# Patient Record
Sex: Female | Born: 1945 | Race: White | Hispanic: No | Marital: Single | Smoking: Never smoker
Health system: Southern US, Community
[De-identification: ages and names within clinical notes are randomized; demographics above are authoritative.]

## PROBLEM LIST (undated history)

## (undated) DIAGNOSIS — E7800 Pure hypercholesterolemia, unspecified: Secondary | ICD-10-CM

## (undated) DIAGNOSIS — C530 Malignant neoplasm of endocervix: Secondary | ICD-10-CM

## (undated) DIAGNOSIS — F3340 Major depressive disorder, recurrent, in remission, unspecified: Secondary | ICD-10-CM

## (undated) DIAGNOSIS — M179 Osteoarthritis of knee, unspecified: Secondary | ICD-10-CM

## (undated) DIAGNOSIS — Z6841 Body Mass Index (BMI) 40.0 and over, adult: Secondary | ICD-10-CM

## (undated) DIAGNOSIS — F5101 Primary insomnia: Secondary | ICD-10-CM

## (undated) DIAGNOSIS — I10 Essential (primary) hypertension: Secondary | ICD-10-CM

## (undated) DIAGNOSIS — M1710 Unilateral primary osteoarthritis, unspecified knee: Secondary | ICD-10-CM

## (undated) DIAGNOSIS — E119 Type 2 diabetes mellitus without complications: Secondary | ICD-10-CM

## (undated) DIAGNOSIS — Z9884 Bariatric surgery status: Secondary | ICD-10-CM

## (undated) DIAGNOSIS — D509 Iron deficiency anemia, unspecified: Secondary | ICD-10-CM

## (undated) HISTORY — PX: COLONOSCOPY: SHX174

## (undated) HISTORY — PX: OOPHORECTOMY: SHX86

## (undated) HISTORY — PX: TUBAL LIGATION: SHX77

## (undated) HISTORY — PX: ABDOMINAL HYSTERECTOMY: SHX81

---

## 2010-05-05 HISTORY — PX: ROUX-EN-Y GASTRIC BYPASS: SHX1104

## 2015-10-02 ENCOUNTER — Encounter: Payer: Self-pay | Admitting: *Deleted

## 2015-10-03 ENCOUNTER — Ambulatory Visit: Payer: Medicare Other | Admitting: Anesthesiology

## 2015-10-03 ENCOUNTER — Ambulatory Visit
Admission: RE | Admit: 2015-10-03 | Discharge: 2015-10-03 | Disposition: A | Discharge status: Home / Self Care | Payer: Medicare Other | Source: Ambulatory Visit | Attending: Unknown Physician Specialty | Admitting: Unknown Physician Specialty

## 2015-10-03 ENCOUNTER — Encounter
Admission: RE | Disposition: A | Discharge status: Home / Self Care | Payer: Self-pay | Source: Ambulatory Visit | Attending: Unknown Physician Specialty

## 2015-10-03 ENCOUNTER — Encounter: Payer: Self-pay | Admitting: *Deleted

## 2015-10-03 DIAGNOSIS — F329 Major depressive disorder, single episode, unspecified: Secondary | ICD-10-CM | POA: Diagnosis not present

## 2015-10-03 DIAGNOSIS — Z1211 Encounter for screening for malignant neoplasm of colon: Secondary | ICD-10-CM | POA: Diagnosis not present

## 2015-10-03 DIAGNOSIS — D122 Benign neoplasm of ascending colon: Secondary | ICD-10-CM | POA: Diagnosis not present

## 2015-10-03 DIAGNOSIS — I10 Essential (primary) hypertension: Secondary | ICD-10-CM | POA: Diagnosis not present

## 2015-10-03 DIAGNOSIS — F5101 Primary insomnia: Secondary | ICD-10-CM | POA: Diagnosis not present

## 2015-10-03 DIAGNOSIS — K640 First degree hemorrhoids: Secondary | ICD-10-CM | POA: Diagnosis not present

## 2015-10-03 DIAGNOSIS — Z79899 Other long term (current) drug therapy: Secondary | ICD-10-CM | POA: Diagnosis not present

## 2015-10-03 DIAGNOSIS — K5520 Angiodysplasia of colon without hemorrhage: Secondary | ICD-10-CM | POA: Insufficient documentation

## 2015-10-03 DIAGNOSIS — M179 Osteoarthritis of knee, unspecified: Secondary | ICD-10-CM | POA: Diagnosis not present

## 2015-10-03 DIAGNOSIS — Z8541 Personal history of malignant neoplasm of cervix uteri: Secondary | ICD-10-CM | POA: Insufficient documentation

## 2015-10-03 DIAGNOSIS — Z86010 Personal history of colonic polyps: Secondary | ICD-10-CM | POA: Diagnosis not present

## 2015-10-03 DIAGNOSIS — Z9884 Bariatric surgery status: Secondary | ICD-10-CM | POA: Diagnosis not present

## 2015-10-03 DIAGNOSIS — Z7982 Long term (current) use of aspirin: Secondary | ICD-10-CM | POA: Insufficient documentation

## 2015-10-03 DIAGNOSIS — E7800 Pure hypercholesterolemia, unspecified: Secondary | ICD-10-CM | POA: Diagnosis not present

## 2015-10-03 HISTORY — DX: Unilateral primary osteoarthritis, unspecified knee: M17.10

## 2015-10-03 HISTORY — DX: Osteoarthritis of knee, unspecified: M17.9

## 2015-10-03 HISTORY — PX: COLONOSCOPY WITH PROPOFOL: SHX5780

## 2015-10-03 HISTORY — DX: Primary insomnia: F51.01

## 2015-10-03 HISTORY — DX: Major depressive disorder, recurrent, in remission, unspecified: F33.40

## 2015-10-03 HISTORY — DX: Type 2 diabetes mellitus without complications: E11.9

## 2015-10-03 HISTORY — DX: Morbid (severe) obesity due to excess calories: E66.01

## 2015-10-03 HISTORY — DX: Malignant neoplasm of endocervix: C53.0

## 2015-10-03 HISTORY — DX: Iron deficiency anemia, unspecified: D50.9

## 2015-10-03 HISTORY — DX: Essential (primary) hypertension: I10

## 2015-10-03 HISTORY — DX: Pure hypercholesterolemia, unspecified: E78.00

## 2015-10-03 HISTORY — DX: Body Mass Index (BMI) 40.0 and over, adult: Z684

## 2015-10-03 HISTORY — DX: Bariatric surgery status: Z98.84

## 2015-10-03 SURGERY — COLONOSCOPY WITH PROPOFOL
Anesthesia: General

## 2015-10-03 MED ORDER — SODIUM CHLORIDE 0.9 % IV SOLN: INTRAVENOUS | Status: DC

## 2015-10-03 MED ORDER — LACTATED RINGERS IV SOLN
INTRAVENOUS | Status: DC | PRN
  Administered 2015-10-03: 10:00:00 via INTRAVENOUS

## 2015-10-03 MED ORDER — SODIUM CHLORIDE 0.9 % IV SOLN
INTRAVENOUS | Status: DC
  Administered 2015-10-03: 1000 mL via INTRAVENOUS

## 2015-10-03 MED ORDER — PROPOFOL 500 MG/50ML IV EMUL
INTRAVENOUS | Status: DC | PRN
  Administered 2015-10-03: 75 ug/kg/min via INTRAVENOUS

## 2015-10-03 MED ORDER — FENTANYL CITRATE (PF) 100 MCG/2ML IJ SOLN
INTRAMUSCULAR | Status: DC | PRN
  Administered 2015-10-03: 50 ug via INTRAVENOUS

## 2015-10-03 MED ORDER — MIDAZOLAM HCL 2 MG/2ML IJ SOLN
INTRAMUSCULAR | Status: DC | PRN
  Administered 2015-10-03: 1 mg via INTRAVENOUS

## 2015-10-03 NOTE — H&P (Signed)
Primary Care Physician:  Barbaraann Boys, MD Primary Gastroenterologist:  Dr. Vira Agar  Pre-Procedure History & Physical: HPI:  Brooke Goodwin is a 70 y.o. female is here for an colonoscopy.   Past Medical History  Diagnosis Date  . Diabetes mellitus type 2, controlled, without complications ([place])   . Depression, major, recurrent, in remission ([place])   . Hypertension   . Hypercholesterolemia   . Hyperplastic colonic polyp   . Iron deficiency anemia   . Morbid obesity with BMI of 45.0-49.9, adult ([place])   . Osteoarthritis of knee   . Primary insomnia   . S/P gastric bypass   . Malignant neoplasm of endocervix [facility])     Past Surgical History  Procedure Laterality Date  . Roux-en-y gastric bypass  2012  . Colonoscopy    . Abdominal hysterectomy    . Tubal ligation      Prior to Admission medications   Medication Sig Start Date End Date Taking? Authorizing Provider  acetaminophen (TYLENOL) 500 MG tablet Take 1,000 mg by mouth every 6 (six) hours as needed.   Yes Historical Provider, MD  aspirin 81 MG tablet Take 81 mg by mouth daily.   Yes Historical Provider, MD  Cholecalciferol (VITAMIN D3) 3000 units TABS Take by mouth.   Yes Historical Provider, MD  ferrous sulfate (FER-IN-SOL) 75 (15 Fe) MG/ML SOLN Take by mouth.   Yes Historical Provider, MD  FLUoxetine (PROZAC) 40 MG capsule Take 40 mg by mouth daily.   Yes Historical Provider, MD  Melatonin 3 MG TABS Take by mouth.   Yes Historical Provider, MD  Metoprolol Succinate (TOPROL XL PO) Take by mouth.   Yes Historical Provider, MD  simvastatin (ZOCOR) 40 MG tablet Take 40 mg by mouth daily.   Yes Historical Provider, MD  sodium fluoride (FLUORIDEX DAILY DEFENSE) 1.1 % GEL dental gel Place 1 application onto teeth at bedtime.   Yes Historical Provider, MD    Allergies as of 09/24/2015  . (Not on File)    History reviewed. No pertinent family history.  Social History   Social History  . Marital Status: Single   Spouse Name: N/A  . Number of Children: N/A  . Years of Education: N/A   Occupational History  . Not on file.   Social History Main Topics  . Smoking status: Never Smoker   . Smokeless tobacco: Not on file  . Alcohol Use: 2.4 oz/week    4 Shots of liquor per week  . Drug Use: No  . Sexual Activity: Not on file   Other Topics Concern  . Not on file   Social History Narrative    Review of Systems: See HPI, otherwise negative ROS  Physical Exam: BP 130/81 mmHg  Pulse 60  Temp(Src) 98.4 F (36.9 C) (Tympanic)  Resp 20  Ht 5\' 5"  (1.651 m)  Wt 130.182 kg (287 lb)  BMI 47.76 kg/m2  SpO2 96% General:   Alert,  pleasant and cooperative in NAD Head:  Normocephalic and atraumatic. Neck:  Supple; no masses or thyromegaly. Lungs:  Clear throughout to auscultation.    Heart:  Regular rate and rhythm. Abdomen:  Soft, nontender and nondistended. Normal bowel sounds, without guarding, and without rebound.   Neurologic:  Alert and  oriented x4;  grossly normal neurologically.  Impression/Plan: Brooke Goodwin is here for an colonoscopy to be performed for [facility] colon polyps  Risks, benefits, limitations, and alternatives regarding  colonoscopy have been reviewed with the patient.  Questions have been answered.  All parties agreeable.   Gaylyn Cheers, MD  10/03/2015, 10:24 AM

## 2015-10-03 NOTE — Anesthesia Postprocedure Evaluation (Signed)
Anesthesia Post Note  Patient: Brooke Goodwin  Procedure(s) Performed: Procedure(s) (LRB): COLONOSCOPY WITH PROPOFOL (N/A)  Patient location during evaluation: PACU Anesthesia Type: General Level of consciousness: awake and alert and oriented Pain management: pain level controlled Vital Signs Assessment: post-procedure vital signs reviewed and stable Respiratory status: spontaneous breathing Cardiovascular status: blood pressure returned to baseline Anesthetic complications: no    Last Vitals:  Filed Vitals:   10/03/15 1120 10/03/15 1130  BP: 152/76 174/79  Pulse: 54 51  Temp:    Resp: 24 17    Last Pain: There were no vitals filed for this visit.               Ruthie Berch

## 2015-10-03 NOTE — Anesthesia Preprocedure Evaluation (Signed)
Anesthesia Evaluation  Patient identified by MRN, date of birth, ID band Patient awake    Reviewed: Allergy & Precautions, H&P , NPO status , Patient's Chart, lab work & pertinent test results  Airway Mallampati: III  TM Distance: >3 FB Neck ROM: limited    Dental  (+) Poor Dentition, Chipped   Pulmonary neg shortness of breath,  Lung collapse as an infant.  Patient reports no lung problems since age 70   Pulmonary exam normal breath sounds clear to auscultation       Cardiovascular Exercise Tolerance: Good hypertension, (-) angina(-) Past MI and (-) DOE Normal cardiovascular exam Rhythm:regular Rate:Normal     Neuro/Psych PSYCHIATRIC DISORDERS Depression negative neurological ROS     GI/Hepatic negative GI ROS, Neg liver ROS,   Endo/Other  diabetesMorbid obesity  Renal/GU negative Renal ROS  negative genitourinary   Musculoskeletal  (+) Arthritis ,   Abdominal   Peds  Hematology negative hematology ROS (+)   Anesthesia Other Findings Past Medical History:   Diabetes mellitus type 2, controlled, without *              Depression, major, recurrent, in remission ([facility]*              Hypertension                                                 Hypercholesterolemia                                         Hyperplastic colonic polyp                                   Iron deficiency anemia                                       Morbid obesity with BMI of 45.0-49.9, adult ([facility]*              Osteoarthritis of knee                                       Primary insomnia                                             S/P gastric bypass                                           Malignant neoplasm of endocervix [facility])                      Past Surgical History:   ROUX-EN-Y GASTRIC BYPASS                         2012  COLONOSCOPY                                                   ABDOMINAL HYSTERECTOMY                                         TUBAL LIGATION                                                  Reproductive/Obstetrics negative OB ROS                             Anesthesia Physical Anesthesia Plan  ASA: III  Anesthesia Plan: General   Post-op Pain Management:    Induction:   Airway Management Planned:   Additional Equipment:   Intra-op Plan:   Post-operative Plan:   Informed Consent: I have reviewed the patients History and Physical, chart, labs and discussed the procedure including the risks, benefits and alternatives for the proposed anesthesia with the patient or authorized representative who has indicated his/her understanding and acceptance.   Dental Advisory Given  Plan Discussed with: Anesthesiologist, CRNA and Surgeon  Anesthesia Plan Comments:         Anesthesia Quick Evaluation

## 2015-10-03 NOTE — Transfer of Care (Signed)
Immediate Anesthesia Transfer of Care Note  Patient: Brooke Goodwin  Procedure(s) Performed: Procedure(s): COLONOSCOPY WITH PROPOFOL (N/A)  Patient Location: PACU  Anesthesia Type:General  Level of Consciousness: awake and alert   Airway & Oxygen Therapy: Patient Spontanous Breathing and Patient connected to nasal cannula oxygen  Post-op Assessment: Report given to RN  Post vital signs: Reviewed and stable  Last Vitals:  Filed Vitals:   10/03/15 0948 10/03/15 1100  BP: 130/81 156/64  Pulse: 60 85  Temp: 36.9 C 37 C  Resp: 20 14    Last Pain: There were no vitals filed for this visit.       Complications: No apparent anesthesia complications

## 2015-10-03 NOTE — Anesthesia Procedure Notes (Signed)
Performed by: Taleya Whitcher Oxygen Delivery Method: Nasal cannula     

## 2015-10-03 NOTE — Op Note (Signed)
[facility] Patient Name: Brooke Goodwin Procedure Date: 10/03/2015 10:21 AM MRN: [id number] Account #: [id number] Date of Birth: Oct 24, 1945 Admit Type: Outpatient Age: 70 Room: [facility] ENDO ROOM 4 Gender: Female Note Status: Finalized Procedure:            Colonoscopy Indications:          High risk colon cancer surveillance: Personal history                        of colonic polyps Providers:            Manya Silvas, MD Referring MD:         Ane Payment (Referring MD) Medicines:            Propofol per Anesthesia Complications:        No immediate complications. Procedure:            Pre-Anesthesia Assessment:                       - After reviewing the risks and benefits, the patient                        was deemed in satisfactory condition to undergo the                        procedure.                       After obtaining informed consent, the colonoscope was                        passed under direct vision. Throughout the procedure,                        the patient's blood pressure, pulse, and oxygen                        saturations were monitored continuously. The                        Colonoscope was introduced through the anus and                        advanced to the the cecum, identified by appendiceal                        orifice and ileocecal valve. The colonoscopy was                        somewhat difficult due to significant looping and a                        tortuous colon. The patient tolerated the procedure                        well. The quality of the bowel preparation was                        excellent. Findings:      A diminutive polyp was found in the proximal ascending colon. The polyp       was  sessile. The polyp was removed with a jumbo cold forceps. Resection       and retrieval were complete.      Internal hemorrhoids were found during endoscopy. The hemorrhoids were       small and Grade  I (internal hemorrhoids that do not prolapse).      The exam was otherwise without abnormality.      A single medium-sized localized angioectasia without bleeding was found       in the proximal ascending colon. No evidence of bleeding so it was left       undisturbed. Impression:           - One diminutive polyp in the proximal ascending colon,                        removed with a jumbo cold forceps. Resected and                        retrieved.                       - Internal hemorrhoids.                       - The examination was otherwise normal. Recommendation:       - Await pathology results. Manya Silvas, MD 10/03/2015 11:00:51 AM This report has been signed electronically. Number of Addenda: 0 Note Initiated On: 10/03/2015 10:21 AM Scope Withdrawal Time: 0 hours 5 minutes 52 seconds  Total Procedure Duration: 0 hours 25 minutes 51 seconds       [facility]

## 2015-10-04 ENCOUNTER — Encounter: Payer: Self-pay | Admitting: Unknown Physician Specialty

## 2015-10-04 LAB — SURGICAL PATHOLOGY

## 2016-01-14 ENCOUNTER — Other Ambulatory Visit: Payer: Self-pay | Admitting: Pediatrics

## 2016-01-14 DIAGNOSIS — Z0000 Encounter for general adult medical examination without abnormal findings: Secondary | ICD-10-CM

## 2016-02-26 ENCOUNTER — Other Ambulatory Visit: Payer: Self-pay | Admitting: Pediatrics

## 2016-02-26 DIAGNOSIS — Z1231 Encounter for screening mammogram for malignant neoplasm of breast: Secondary | ICD-10-CM

## 2016-03-03 ENCOUNTER — Ambulatory Visit
Admission: RE | Admit: 2016-03-03 | Discharge: 2016-03-03 | Disposition: A | Discharge status: Home / Self Care | Payer: Medicare Other | Source: Ambulatory Visit | Attending: Pediatrics | Admitting: Pediatrics

## 2016-03-03 DIAGNOSIS — Z0000 Encounter for general adult medical examination without abnormal findings: Secondary | ICD-10-CM | POA: Insufficient documentation

## 2016-03-03 DIAGNOSIS — Z1231 Encounter for screening mammogram for malignant neoplasm of breast: Secondary | ICD-10-CM | POA: Insufficient documentation

## 2016-03-03 DIAGNOSIS — M85852 Other specified disorders of bone density and structure, left thigh: Secondary | ICD-10-CM | POA: Diagnosis not present

## 2016-03-03 DIAGNOSIS — Z780 Asymptomatic menopausal state: Secondary | ICD-10-CM | POA: Diagnosis not present

## 2016-03-10 ENCOUNTER — Other Ambulatory Visit: Payer: Self-pay | Admitting: *Deleted

## 2016-03-10 ENCOUNTER — Ambulatory Visit
Admission: RE | Admit: 2016-03-10 | Discharge: 2016-03-10 | Disposition: A | Discharge status: Home / Self Care | Payer: Self-pay | Source: Ambulatory Visit | Attending: *Deleted | Admitting: *Deleted

## 2016-03-10 DIAGNOSIS — Z9289 Personal history of other medical treatment: Secondary | ICD-10-CM

## 2017-12-30 IMAGING — MG MM DIGITAL SCREENING BILAT W/ TOMO W/ CAD
8 of 12 series · 8 of 28 positions shown · non-contrast
Comparison: Previous exam(s).

CLINICAL DATA: Screening.

EXAM:
2D DIGITAL SCREENING BILATERAL MAMMOGRAM WITH CAD AND ADJUNCT TOMO

[L CC synth-2D]
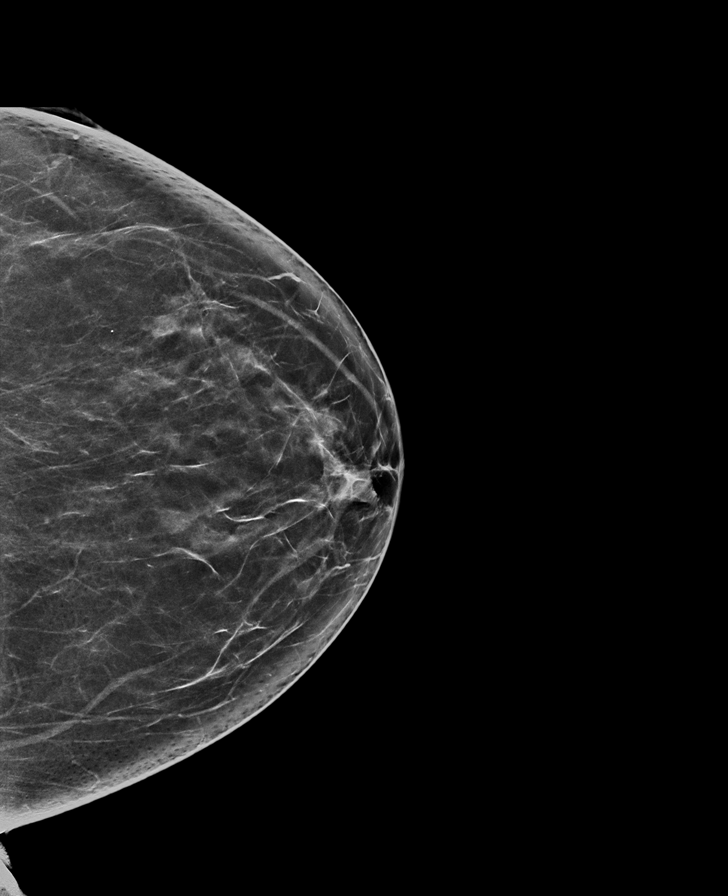

[L MLO synth-2D]
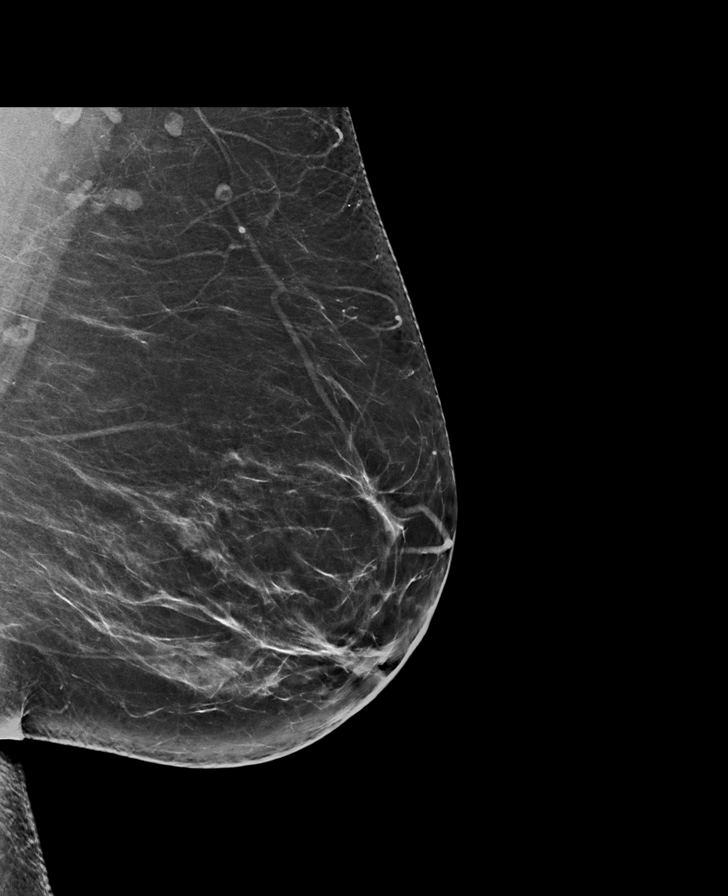

[R CC]
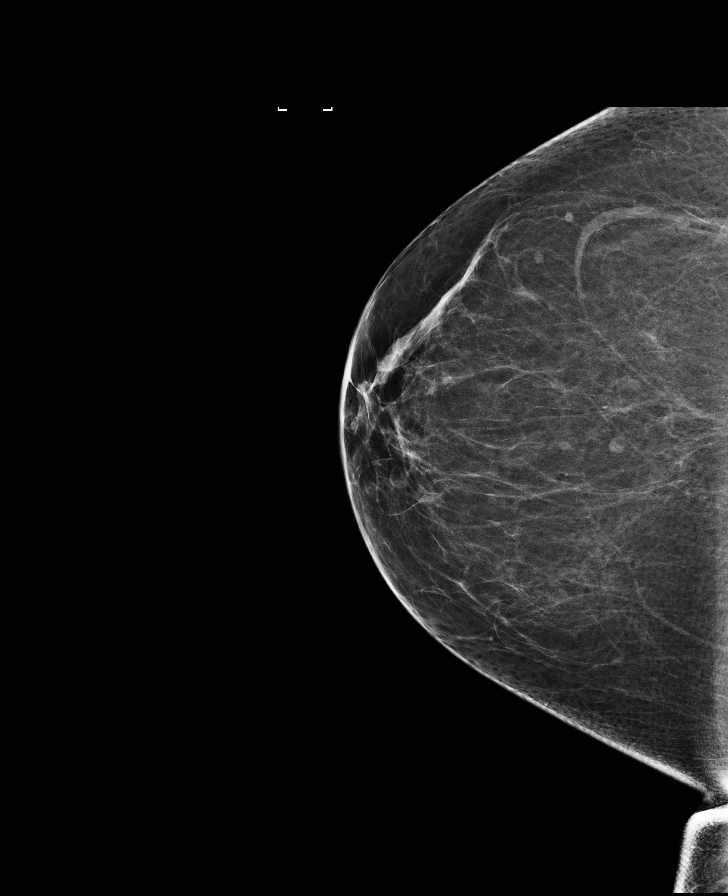

[L MLO]
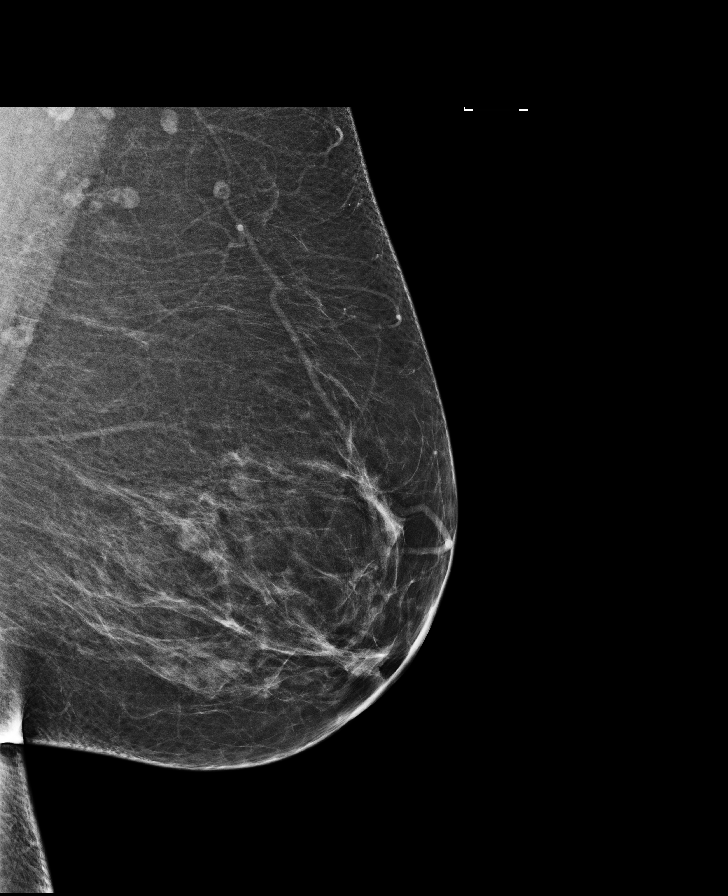

[R CC synth-2D]
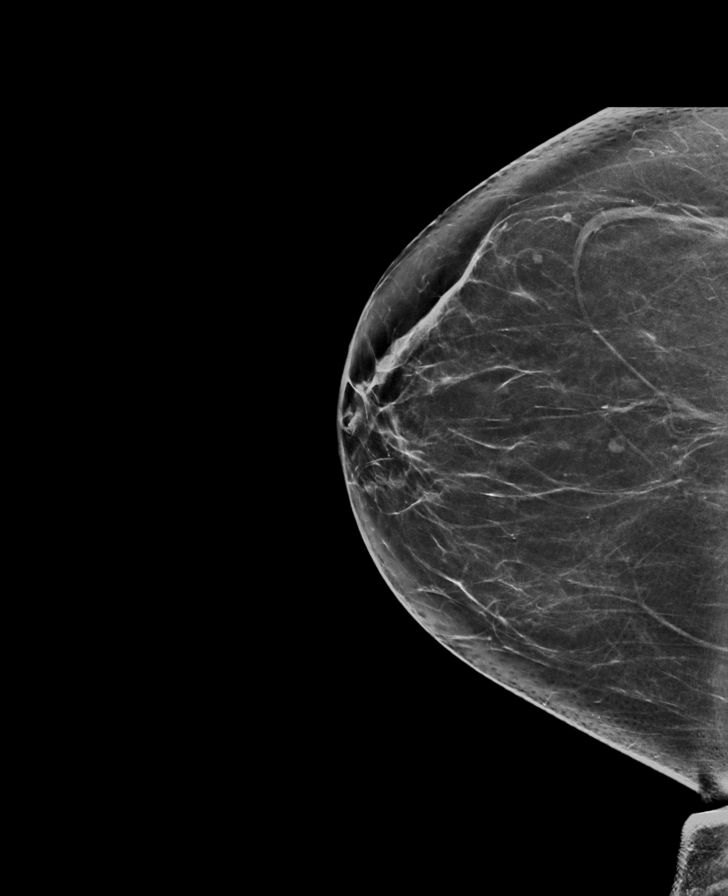

[R MLO synth-2D]
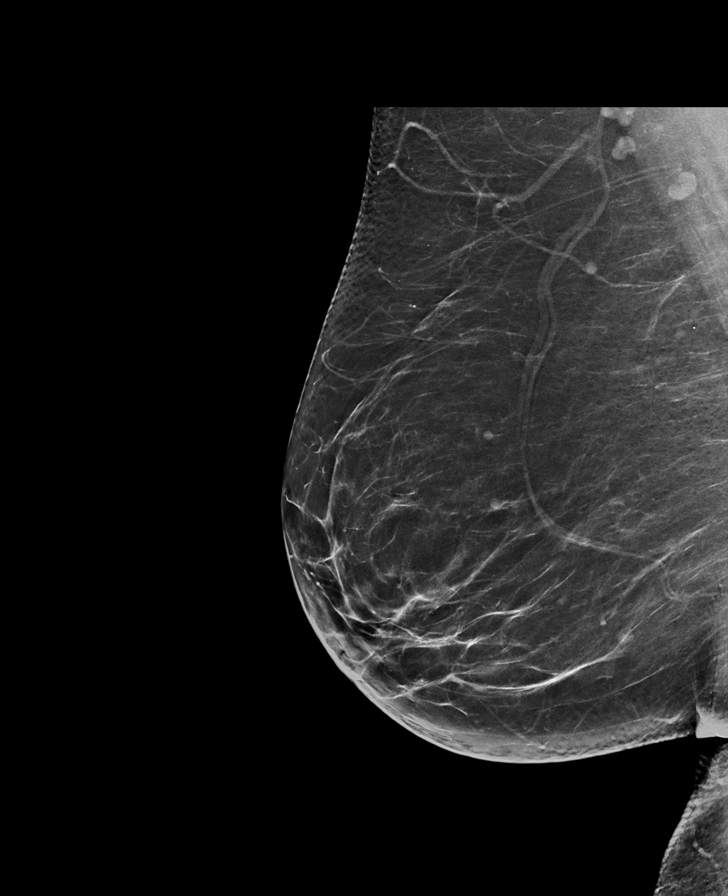

[L CC]
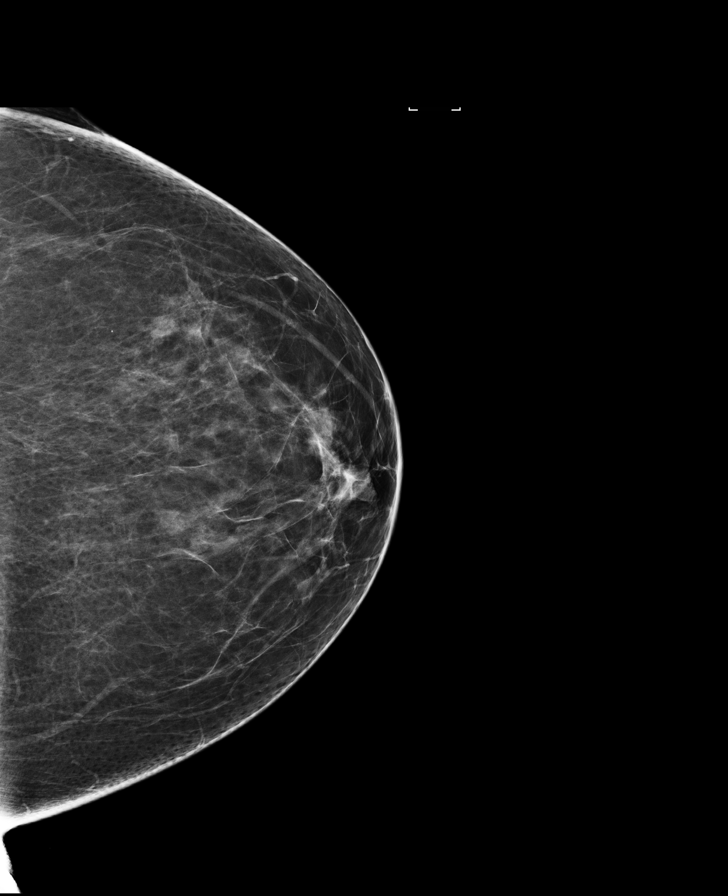

[R MLO]
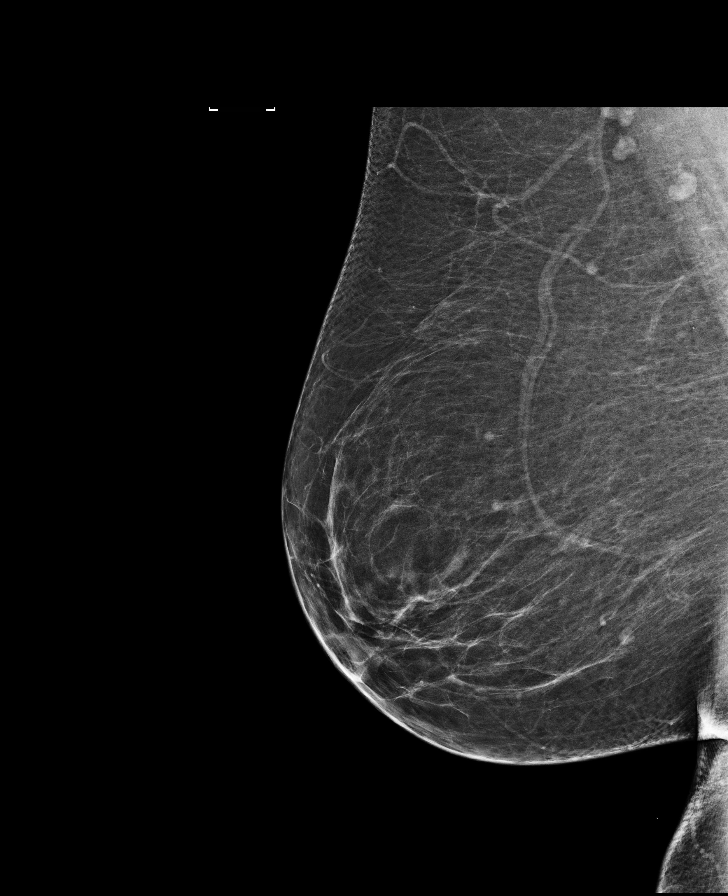

[8 of 28 positions shown; findings below may reference images not displayed]

ACR Breast Density Category b: There are scattered areas of
fibroglandular density.
FINDINGS: There are no findings suspicious for malignancy. Images were
processed with CAD.
IMPRESSION: No mammographic evidence of malignancy. A result letter of this
screening mammogram will be mailed directly to the patient.

RECOMMENDATION:
Screening mammogram in one year. (Code:97-6-RS4)

BI-RADS CATEGORY  1: Negative.

## 2018-04-14 ENCOUNTER — Other Ambulatory Visit: Payer: Self-pay | Admitting: Pediatrics

## 2018-04-14 DIAGNOSIS — Z1231 Encounter for screening mammogram for malignant neoplasm of breast: Secondary | ICD-10-CM

## 2018-04-15 ENCOUNTER — Encounter (INDEPENDENT_AMBULATORY_CARE_PROVIDER_SITE_OTHER): Payer: Self-pay

## 2018-04-15 ENCOUNTER — Ambulatory Visit
Admission: RE | Admit: 2018-04-15 | Discharge: 2018-04-15 | Disposition: A | Discharge status: Home / Self Care | Payer: Medicare Other | Source: Ambulatory Visit | Attending: Pediatrics | Admitting: Pediatrics

## 2018-04-15 DIAGNOSIS — Z1231 Encounter for screening mammogram for malignant neoplasm of breast: Secondary | ICD-10-CM | POA: Diagnosis present

## 2020-07-13 ENCOUNTER — Other Ambulatory Visit: Payer: Self-pay | Admitting: Pediatrics

## 2020-07-13 DIAGNOSIS — Z1231 Encounter for screening mammogram for malignant neoplasm of breast: Secondary | ICD-10-CM

## 2020-07-16 ENCOUNTER — Ambulatory Visit
Admission: RE | Admit: 2020-07-16 | Discharge: 2020-07-16 | Disposition: A | Discharge status: Home / Self Care | Payer: Medicare PPO | Source: Ambulatory Visit | Attending: Pediatrics | Admitting: Pediatrics

## 2020-07-16 ENCOUNTER — Other Ambulatory Visit: Payer: Self-pay

## 2020-07-16 DIAGNOSIS — Z1231 Encounter for screening mammogram for malignant neoplasm of breast: Secondary | ICD-10-CM | POA: Insufficient documentation

## 2020-07-23 ENCOUNTER — Ambulatory Visit: Payer: Medicare Other

## 2021-03-29 ENCOUNTER — Other Ambulatory Visit: Payer: Self-pay

## 2021-04-19 ENCOUNTER — Encounter: Admission: RE | Payer: Self-pay | Source: Home / Self Care

## 2021-04-19 ENCOUNTER — Ambulatory Visit: Admission: RE | Admit: 2021-04-19 | Payer: Medicare PPO | Source: Home / Self Care | Admitting: Gastroenterology

## 2021-04-19 SURGERY — COLONOSCOPY
Anesthesia: General

## 2021-09-23 ENCOUNTER — Other Ambulatory Visit: Payer: Self-pay | Admitting: Pediatrics

## 2021-09-23 DIAGNOSIS — Z1231 Encounter for screening mammogram for malignant neoplasm of breast: Secondary | ICD-10-CM

## 2021-10-24 ENCOUNTER — Ambulatory Visit
Admission: RE | Admit: 2021-10-24 | Discharge: 2021-10-24 | Disposition: A | Discharge status: Home / Self Care | Payer: Medicare PPO | Source: Ambulatory Visit | Attending: Pediatrics | Admitting: Pediatrics

## 2021-10-24 DIAGNOSIS — Z1231 Encounter for screening mammogram for malignant neoplasm of breast: Secondary | ICD-10-CM | POA: Insufficient documentation

## 2021-12-16 ENCOUNTER — Other Ambulatory Visit: Payer: Self-pay | Admitting: Pediatrics

## 2021-12-16 DIAGNOSIS — Z780 Asymptomatic menopausal state: Secondary | ICD-10-CM

## 2022-01-09 ENCOUNTER — Ambulatory Visit
Admission: RE | Admit: 2022-01-09 | Discharge: 2022-01-09 | Disposition: A | Discharge status: Home / Self Care | Payer: Medicare PPO | Source: Ambulatory Visit | Attending: Pediatrics | Admitting: Pediatrics

## 2022-01-09 DIAGNOSIS — Z780 Asymptomatic menopausal state: Secondary | ICD-10-CM | POA: Insufficient documentation

## 2022-10-30 ENCOUNTER — Encounter: Payer: Self-pay | Admitting: *Deleted

## 2022-10-31 ENCOUNTER — Encounter
Admission: RE | Disposition: A | Discharge status: Home / Self Care | Payer: Self-pay | Source: Home / Self Care | Attending: Gastroenterology

## 2022-10-31 ENCOUNTER — Ambulatory Visit: Payer: Medicare PPO | Admitting: Anesthesiology

## 2022-10-31 ENCOUNTER — Ambulatory Visit
Admission: RE | Admit: 2022-10-31 | Discharge: 2022-10-31 | Disposition: A | Discharge status: Home / Self Care | Payer: Medicare PPO | Attending: Gastroenterology | Admitting: Gastroenterology

## 2022-10-31 DIAGNOSIS — I10 Essential (primary) hypertension: Secondary | ICD-10-CM | POA: Diagnosis not present

## 2022-10-31 DIAGNOSIS — F3340 Major depressive disorder, recurrent, in remission, unspecified: Secondary | ICD-10-CM | POA: Insufficient documentation

## 2022-10-31 DIAGNOSIS — Z86010 Personal history of colonic polyps: Secondary | ICD-10-CM | POA: Insufficient documentation

## 2022-10-31 DIAGNOSIS — Z8541 Personal history of malignant neoplasm of cervix uteri: Secondary | ICD-10-CM | POA: Insufficient documentation

## 2022-10-31 DIAGNOSIS — D649 Anemia, unspecified: Secondary | ICD-10-CM | POA: Insufficient documentation

## 2022-10-31 DIAGNOSIS — K5730 Diverticulosis of large intestine without perforation or abscess without bleeding: Secondary | ICD-10-CM | POA: Diagnosis not present

## 2022-10-31 DIAGNOSIS — E7800 Pure hypercholesterolemia, unspecified: Secondary | ICD-10-CM | POA: Diagnosis not present

## 2022-10-31 DIAGNOSIS — Z7984 Long term (current) use of oral hypoglycemic drugs: Secondary | ICD-10-CM | POA: Diagnosis not present

## 2022-10-31 DIAGNOSIS — M1990 Unspecified osteoarthritis, unspecified site: Secondary | ICD-10-CM | POA: Diagnosis not present

## 2022-10-31 DIAGNOSIS — K640 First degree hemorrhoids: Secondary | ICD-10-CM | POA: Insufficient documentation

## 2022-10-31 DIAGNOSIS — Z9884 Bariatric surgery status: Secondary | ICD-10-CM | POA: Diagnosis not present

## 2022-10-31 DIAGNOSIS — Z1211 Encounter for screening for malignant neoplasm of colon: Secondary | ICD-10-CM | POA: Diagnosis present

## 2022-10-31 DIAGNOSIS — Z79899 Other long term (current) drug therapy: Secondary | ICD-10-CM | POA: Insufficient documentation

## 2022-10-31 DIAGNOSIS — E119 Type 2 diabetes mellitus without complications: Secondary | ICD-10-CM | POA: Insufficient documentation

## 2022-10-31 DIAGNOSIS — Z6841 Body Mass Index (BMI) 40.0 and over, adult: Secondary | ICD-10-CM | POA: Insufficient documentation

## 2022-10-31 HISTORY — PX: COLONOSCOPY WITH PROPOFOL: SHX5780

## 2022-10-31 LAB — GLUCOSE, CAPILLARY: Glucose-Capillary: 168 mg/dL — ABNORMAL HIGH (ref 70–99)

## 2022-10-31 SURGERY — COLONOSCOPY WITH PROPOFOL
Anesthesia: General

## 2022-10-31 MED ORDER — PROPOFOL 500 MG/50ML IV EMUL
INTRAVENOUS | Status: DC | PRN
  Administered 2022-10-31: 50 mg via INTRAVENOUS
  Administered 2022-10-31: 150 ug/kg/min via INTRAVENOUS

## 2022-10-31 MED ORDER — SODIUM CHLORIDE 0.9 % IV SOLN: INTRAVENOUS | Status: DC

## 2022-10-31 MED ORDER — LIDOCAINE HCL (CARDIAC) PF 100 MG/5ML IV SOSY
PREFILLED_SYRINGE | INTRAVENOUS | Status: DC | PRN
  Administered 2022-10-31: 100 mg via INTRAVENOUS

## 2022-10-31 NOTE — Op Note (Signed)
[facility] Patient Name: Consuelo Geralds Procedure Date: 10/31/2022 12:22 PM MRN: [id number] Account #: [id number] Date of Birth: 30-Nov-1945 Admit Type: Outpatient Age: 77 Room: [facility] ENDO ROOM 3 Gender: Female Note Status: Finalized Instrument Name: Prentice Docker [id number] Procedure:             Colonoscopy Indications:           High risk colon cancer surveillance: Personal history                         of sessile serrated colon polyp (less than 10 mm in                         size) with no dysplasia Providers:             Eather Colas MD, MD Referring MD:          Daniel Nones, MD (Referring MD) Medicines:             Monitored Anesthesia Care Complications:         No immediate complications. Procedure:             Pre-Anesthesia Assessment:                        - Prior to the procedure, a History and Physical was                         performed, and patient medications and allergies were                         reviewed. The patient is competent. The risks and                         benefits of the procedure and the sedation options and                         risks were discussed with the patient. All questions                         were answered and informed consent was obtained.                         Patient identification and proposed procedure were                         verified by the physician, the nurse, the                         anesthesiologist, the anesthetist and the technician                         in the endoscopy suite. Mental Status Examination:                         alert and oriented. Airway Examination: normal                         oropharyngeal airway and neck mobility. Respiratory  Examination: clear to auscultation. CV Examination:                         normal. Prophylactic Antibiotics: The patient does not                         require prophylactic antibiotics. Prior                          Anticoagulants: The patient has taken no anticoagulant                         or antiplatelet agents. ASA Grade Assessment: III - A                         patient with severe systemic disease. After reviewing                         the risks and benefits, the patient was deemed in                         satisfactory condition to undergo the procedure. The                         anesthesia plan was to use monitored anesthesia care                         (MAC). Immediately prior to administration of                         medications, the patient was re-assessed for adequacy                         to receive sedatives. The heart rate, respiratory                         rate, oxygen saturations, blood pressure, adequacy of                         pulmonary ventilation, and response to care were                         monitored throughout the procedure. The physical                         status of the patient was re-assessed after the                         procedure.                        After obtaining informed consent, the colonoscope was                         passed under direct vision. Throughout the procedure,                         the patient's blood pressure, pulse, and oxygen  saturations were monitored continuously. The                         Colonoscope was introduced through the anus and                         advanced to the the cecum, identified by appendiceal                         orifice and ileocecal valve. The colonoscopy was                         somewhat difficult due to a redundant colon. The                         patient tolerated the procedure well. The quality of                         the bowel preparation was good. The ileocecal valve,                         appendiceal orifice, and rectum were photographed. Findings:      The perianal and digital rectal examinations were normal.       Scattered large-mouthed and small-mouthed diverticula were found in the       sigmoid colon, descending colon and cecum.      Internal hemorrhoids were found during retroflexion. The hemorrhoids       were Grade I (internal hemorrhoids that do not prolapse).      The exam was otherwise without abnormality on direct and retroflexion       views. Impression:            - Diverticulosis in the sigmoid colon, in the                         descending colon and in the cecum.                        - Internal hemorrhoids.                        - The examination was otherwise normal on direct and                         retroflexion views.                        - No specimens collected. Recommendation:        - Discharge patient to home.                        - Resume previous diet.                        - Continue present medications.                        - Repeat colonoscopy is not recommended due to current  age (66 years or older) for surveillance.                        - Return to referring physician as previously                         scheduled. Procedure Code(s):     --- Professional ---                        Z6109, Colorectal cancer screening; colonoscopy on                         individual at high risk Diagnosis Code(s):     --- Professional ---                        Z86.010, Personal history of colonic polyps                        K64.0, First degree hemorrhoids                        K57.30, Diverticulosis of large intestine without                         perforation or abscess without bleeding CPT copyright 2022 American Medical Association. All rights reserved. The codes documented in this report are preliminary and upon coder review may  be revised to meet current compliance requirements. Eather Colas MD, MD 10/31/2022 1:51:38 PM Number of Addenda: 0 Note Initiated On: 10/31/2022 12:22 PM Scope Withdrawal Time: 0 hours 16 minutes 17  seconds  Total Procedure Duration: 0 hours 25 minutes 46 seconds  Estimated Blood Loss:  Estimated blood loss: none.      [facility]

## 2022-10-31 NOTE — H&P (Signed)
Outpatient short stay form Pre-procedure 10/31/2022  Regis Bill, MD  Primary Physician: Orene Desanctis, MD  Reason for visit:  Surveillance colonoscopy  History of present illness:    77 y/o lady with history of obesity, gastric bypass, cervical cancer s/p hysterectomy, and DM II here for surveillance colonoscopy. Last colonoscopy in 2017 with small SSA. No family history of GI malignancies. No blood thinners.    Current Facility-Administered Medications:    0.9 %  sodium chloride infusion, , Intravenous, Continuous, Rory Montel, Rossie Muskrat, MD, Last Rate: 20 mL/hr at 10/31/22 1227, Continued from Pre-op at 10/31/22 1227  Medications Prior to Admission  Medication Sig Dispense Refill Last Dose   FLUoxetine (PROZAC) 40 MG capsule Take 40 mg by mouth daily.   10/30/2022   Melatonin 3 MG TABS Take by mouth.   10/30/2022   metFORMIN (GLUCOPHAGE) 1000 MG tablet Take 1,000 mg by mouth 2 (two) times daily with a meal.   10/30/2022   Metoprolol Succinate (TOPROL XL PO) Take by mouth.   10/30/2022   simvastatin (ZOCOR) 40 MG tablet Take 40 mg by mouth daily.   10/30/2022   acetaminophen (TYLENOL) 500 MG tablet Take 1,000 mg by mouth every 6 (six) hours as needed.      aspirin 81 MG tablet Take 81 mg by mouth daily.   10/24/2022   Cholecalciferol (VITAMIN D3) 3000 units TABS Take by mouth.      ferrous sulfate (FER-IN-SOL) 75 (15 Fe) MG/ML SOLN Take by mouth.   10/24/2022   sodium fluoride (FLUORIDEX DAILY DEFENSE) 1.1 % GEL dental gel Place 1 application onto teeth at bedtime.        Allergies  Allergen Reactions   Influenza Virus Vac Live Quad    Ivp Dye [Iodinated Contrast Media]    Penicillins    Tetanus Toxoids    Zoster Vaccine Live      Past Medical History:  Diagnosis Date   Depression, major, recurrent, in remission ([facility])    Diabetes mellitus type 2, controlled, without complications ([facility])    Hypercholesterolemia    Hypertension    Iron deficiency anemia    Malignant  neoplasm of endocervix ([facility])    Morbid obesity with BMI of 45.0-49.9, adult ([facility])    Osteoarthritis of knee    Primary insomnia    S/P gastric bypass     Review of systems:  Otherwise negative.    Physical Exam  Gen: Alert, oriented. Appears stated age.  HEENT: PERRLA. Lungs: No respiratory distress CV: RRR Abd: soft, benign, no masses Ext: No edema    Planned procedures: Proceed with colonoscopy. The patient understands the nature of the planned procedure, indications, risks, alternatives and potential complications including but not limited to bleeding, infection, perforation, damage to internal organs and possible oversedation/side effects from anesthesia. The patient agrees and gives consent to proceed.  Please refer to procedure notes for findings, recommendations and patient disposition/instructions.     Regis Bill, MD [facility]

## 2022-10-31 NOTE — Anesthesia Preprocedure Evaluation (Signed)
Anesthesia Evaluation  Patient identified by MRN, date of birth, ID band Patient awake    Reviewed: Allergy & Precautions, NPO status , Patient's Chart, lab work & pertinent test results  Airway Mallampati: III  TM Distance: >3 FB Neck ROM: full    Dental  (+) Upper Dentures   Pulmonary neg pulmonary ROS   Pulmonary exam normal  + decreased breath sounds      Cardiovascular Exercise Tolerance: Good hypertension, Pt. on medications negative cardio ROS Normal cardiovascular exam Rhythm:Regular Rate:Normal     Neuro/Psych  PSYCHIATRIC DISORDERS  Depression    negative neurological ROS  negative psych ROS   GI/Hepatic negative GI ROS, Neg liver ROS,,,  Endo/Other  negative endocrine ROSdiabetes, Type 2, Oral Hypoglycemic Agents  Morbid obesity  Renal/GU negative Renal ROS  negative genitourinary   Musculoskeletal  (+) Arthritis ,    Abdominal  (+) + obese  Peds negative pediatric ROS (+)  Hematology negative hematology ROS (+) Blood dyscrasia, anemia   Anesthesia Other Findings Past Medical History: No date: Depression, major, recurrent, in remission ([facility]) No date: Diabetes mellitus type 2, controlled, without complications  ([facility]) No date: Hypercholesterolemia No date: Hypertension No date: Iron deficiency anemia No date: Malignant neoplasm of endocervix ([facility]) No date: Morbid obesity with BMI of 45.0-49.9, adult ([facility]) No date: Osteoarthritis of knee No date: Primary insomnia No date: S/P gastric bypass  Past Surgical History: No date: ABDOMINAL HYSTERECTOMY No date: COLONOSCOPY 10/03/2015: COLONOSCOPY WITH PROPOFOL; N/A     Comment:  Procedure: COLONOSCOPY WITH PROPOFOL;  Surgeon: Scot Jun, MD;  Location: [facility];  Service:               Endoscopy;  Laterality: N/A; No date: OOPHORECTOMY; Bilateral 2012: ROUX-EN-Y GASTRIC BYPASS No date: TUBAL LIGATION  BMI    Body Mass  Index: 46.98 kg/m      Reproductive/Obstetrics negative OB ROS                             Anesthesia Physical Anesthesia Plan  ASA: 3  Anesthesia Plan: General   Post-op Pain Management:    Induction: Intravenous  PONV Risk Score and Plan: Propofol infusion and TIVA  Airway Management Planned: Oral ETT  Additional Equipment:   Intra-op Plan:   Post-operative Plan:   Informed Consent: I have reviewed the patients History and Physical, chart, labs and discussed the procedure including the risks, benefits and alternatives for the proposed anesthesia with the patient or authorized representative who has indicated his/her understanding and acceptance.     Dental Advisory Given  Plan Discussed with: CRNA and Surgeon  Anesthesia Plan Comments: (Patient consented for risks of anesthesia including but not limited to:  - adverse reactions to medications - risk of airway placement if required - damage to eyes, teeth, lips or other oral mucosa - nerve damage due to positioning  - sore throat or hoarseness - Damage to heart, brain, nerves, lungs, other parts of body or loss of life  Patient voiced understanding.)       Anesthesia Quick Evaluation

## 2022-10-31 NOTE — Interval H&P Note (Signed)
History and Physical Interval Note:  10/31/2022 12:31 PM  Brooke Goodwin  has presented today for surgery, with the diagnosis of hX OF ADENOMATOUS POLYP OF COLON.  The various methods of treatment have been discussed with the patient and family. After consideration of risks, benefits and other options for treatment, the patient has consented to  Procedure(s) with comments: COLONOSCOPY WITH PROPOFOL (N/A) - DM as a surgical intervention.  The patient's history has been reviewed, patient examined, no change in status, stable for surgery.  I have reviewed the patient's chart and labs.  Questions were answered to the patient's satisfaction.     Regis Bill  Ok to proceed with colonoscopy

## 2022-10-31 NOTE — Transfer of Care (Signed)
Immediate Anesthesia Transfer of Care Note  Patient: Brooke Goodwin  Procedure(s) Performed: COLONOSCOPY WITH PROPOFOL  Patient Location: PACU  Anesthesia Type:General  Level of Consciousness: patient cooperative and responds to stimulation  Airway & Oxygen Therapy: Patient Spontanous Breathing and Patient connected to nasal cannula oxygen  Post-op Assessment: Report given to RN and Post -op Vital signs reviewed and stable  Post vital signs: stable  Last Vitals:  Vitals Value Taken Time  BP 123/60 10/31/22 1352  Temp    Pulse 69 10/31/22 1352  Resp 15 10/31/22 1352  SpO2 91 % 10/31/22 1352  Vitals shown include unvalidated device data.  Last Pain:  Vitals:   10/31/22 1157  TempSrc: Temporal  PainSc: 0-No pain         Complications: No notable events documented.

## 2022-11-03 ENCOUNTER — Encounter: Payer: Self-pay | Admitting: Gastroenterology

## 2022-11-14 NOTE — Anesthesia Postprocedure Evaluation (Signed)
Anesthesia Post Note  Patient: Brooke Goodwin  Procedure(s) Performed: COLONOSCOPY WITH PROPOFOL  Patient location during evaluation: PACU Anesthesia Type: General Level of consciousness: awake Pain management: satisfactory to patient Vital Signs Assessment: post-procedure vital signs reviewed and stable Respiratory status: spontaneous breathing and nonlabored ventilation Cardiovascular status: stable Anesthetic complications: no   There were no known notable events for this encounter.   Last Vitals:  Vitals:   10/31/22 1157 10/31/22 1353  BP: (!) 173/74   Pulse: 74   Resp: 16   Temp: (!) 35.2 C 36.9 C  SpO2: 97% 93%    Last Pain:  Vitals:   11/01/22 0916  TempSrc:   PainSc: 0-No pain                 VAN STAVEREN,Akaya Proffit
